# Patient Record
Sex: Female | Born: 1970 | Race: White | Hispanic: No | Marital: Married | Smoking: Never smoker
Health system: Southern US, Community
[De-identification: ages and names within clinical notes are randomized; demographics above are authoritative.]

## PROBLEM LIST (undated history)

## (undated) DIAGNOSIS — N200 Calculus of kidney: Secondary | ICD-10-CM

---

## 1999-02-27 HISTORY — PX: THYROIDECTOMY, PARTIAL: SHX18

## 2014-07-28 ENCOUNTER — Emergency Department (HOSPITAL_BASED_OUTPATIENT_CLINIC_OR_DEPARTMENT_OTHER): Payer: Managed Care, Other (non HMO)

## 2014-07-28 ENCOUNTER — Encounter (HOSPITAL_BASED_OUTPATIENT_CLINIC_OR_DEPARTMENT_OTHER): Payer: Self-pay | Admitting: Emergency Medicine

## 2014-07-28 ENCOUNTER — Emergency Department (HOSPITAL_BASED_OUTPATIENT_CLINIC_OR_DEPARTMENT_OTHER)
Admission: EM | Admit: 2014-07-28 | Discharge: 2014-07-28 | Disposition: A | Discharge status: Home / Self Care | Payer: Managed Care, Other (non HMO) | Attending: Emergency Medicine | Admitting: Emergency Medicine

## 2014-07-28 DIAGNOSIS — Z87442 Personal history of urinary calculi: Secondary | ICD-10-CM | POA: Insufficient documentation

## 2014-07-28 DIAGNOSIS — N201 Calculus of ureter: Secondary | ICD-10-CM

## 2014-07-28 DIAGNOSIS — R109 Unspecified abdominal pain: Secondary | ICD-10-CM

## 2014-07-28 DIAGNOSIS — Z3202 Encounter for pregnancy test, result negative: Secondary | ICD-10-CM | POA: Insufficient documentation

## 2014-07-28 HISTORY — DX: Calculus of kidney: N20.0

## 2014-07-28 LAB — BASIC METABOLIC PANEL
ANION GAP: 9 (ref 5–15)
BUN: 21 mg/dL — ABNORMAL HIGH (ref 6–20)
CALCIUM: 8.8 mg/dL — AB (ref 8.9–10.3)
CHLORIDE: 103 mmol/L (ref 101–111)
CO2: 25 mmol/L (ref 22–32)
Creatinine, Ser: 0.86 mg/dL (ref 0.44–1.00)
GFR calc Af Amer: >60 mL/min (ref >60)
GFR calc non Af Amer: >60 mL/min (ref >60)
Glucose, Bld: 117 mg/dL — ABNORMAL HIGH (ref 65–99)
Potassium: 3.8 mmol/L (ref 3.5–5.1)
SODIUM: 137 mmol/L (ref 135–145)

## 2014-07-28 LAB — URINE MICROSCOPIC-ADD ON

## 2014-07-28 LAB — PREGNANCY, URINE: PREG TEST UR: NEGATIVE

## 2014-07-28 LAB — URINALYSIS, ROUTINE W REFLEX MICROSCOPIC
Bilirubin Urine: NEGATIVE
Glucose, UA: NEGATIVE mg/dL
Ketones, ur: NEGATIVE mg/dL
Leukocytes, UA: NEGATIVE
NITRITE: NEGATIVE
PH: 5.5 (ref 5.0–8.0)
Protein, ur: 30 mg/dL — AB
Specific Gravity, Urine: 1.034 — ABNORMAL HIGH (ref 1.005–1.030)
UROBILINOGEN UA: 0.2 mg/dL (ref 0.0–1.0)

## 2014-07-28 MED ORDER — SODIUM CHLORIDE 0.9 % IV BOLUS (SEPSIS)
1000.0000 mL | Freq: Once | INTRAVENOUS | Status: AC
  Administered 2014-07-28: 1000 mL via INTRAVENOUS

## 2014-07-28 MED ORDER — OXYCODONE-ACETAMINOPHEN 10-325 MG PO TABS: 1.0000 | ORAL_TABLET | ORAL | Status: AC | PRN

## 2014-07-28 MED ORDER — ONDANSETRON 4 MG PO TBDP: 4.0000 mg | ORAL_TABLET | Freq: Three times a day (TID) | ORAL | Status: AC | PRN

## 2014-07-28 MED ORDER — HYDROMORPHONE HCL 1 MG/ML IJ SOLN
1.0000 mg | Freq: Once | INTRAMUSCULAR | Status: AC
  Administered 2014-07-28: 1 mg via INTRAVENOUS
  Filled 2014-07-28: qty 1

## 2014-07-28 MED ORDER — KETOROLAC TROMETHAMINE 30 MG/ML IJ SOLN
30.0000 mg | Freq: Once | INTRAMUSCULAR | Status: AC
  Administered 2014-07-28: 30 mg via INTRAVENOUS
  Filled 2014-07-28: qty 1

## 2014-07-28 MED ORDER — TAMSULOSIN HCL 0.4 MG PO CAPS: 0.4000 mg | ORAL_CAPSULE | Freq: Every day | ORAL | Status: AC

## 2014-07-28 MED ORDER — ONDANSETRON HCL 4 MG/2ML IJ SOLN
4.0000 mg | Freq: Once | INTRAMUSCULAR | Status: AC
  Administered 2014-07-28: 4 mg via INTRAVENOUS
  Filled 2014-07-28: qty 2

## 2014-07-28 NOTE — ED Provider Notes (Addendum)
CSN: [id number]     Arrival date & time 07/28/14  1203 History   First MD Initiated Contact with Patient 07/28/14 1312     Chief Complaint  Patient presents with  . Flank Pain    left side     (Consider location/radiation/quality/duration/timing/severity/associated sxs/prior Treatment) HPI Comments: Acute onset of left flank pain that started this morning. No fever. Has a history of stones. Had bladder spasms last night  Patient is a 44 y.o. female presenting with flank pain. The history is provided by the patient. No language interpreter was used.  Flank Pain This is a new problem. The current episode started today. The problem occurs constantly. The problem has been unchanged. Associated symptoms include vomiting. Pertinent negatives include no fever. Nothing aggravates the symptoms. She has tried nothing for the symptoms.    Past Medical History  Diagnosis Date  . Kidney stones    History reviewed. No pertinent past surgical history. History reviewed. No pertinent family history. History  Substance Use Topics  . Smoking status: Never Smoker   . Smokeless tobacco: Not on file  . Alcohol Use: No   OB History    No data available     Review of Systems  Constitutional: Negative for fever.  Gastrointestinal: Positive for vomiting.  Genitourinary: Positive for flank pain.      Allergies  Review of patient's allergies indicates no known allergies.  Home Medications   Prior to Admission medications   Medication Sig Start Date End Date Taking? Authorizing Provider  Norethindrone-Eth Estradiol (NORTREL 0.5/35, 28, PO) Take by mouth.   Yes Historical Provider, MD   BP 138/69 mmHg  Pulse 85  Temp(Src) 98.1 F (36.7 C) (Oral)  Resp 18  Ht  (1.676 m)  Wt 150 lb (68.04 kg)  BMI 24.22 kg/m2  SpO2 100%  LMP 07/28/2014 Physical Exam  Constitutional: She is oriented to person, place, and time. She appears well-developed and well-nourished.  Cardiovascular: Regular  rhythm.   Pulmonary/Chest: Effort normal.  Abdominal: Soft.  Musculoskeletal: Normal range of motion.  Neurological: She is alert and oriented to person, place, and time. Coordination normal.  Skin: Skin is warm and dry.    ED Course  Procedures (including critical care time) Labs Review Labs Reviewed  URINALYSIS, ROUTINE W REFLEX MICROSCOPIC (NOT AT [facility]) - Abnormal; Notable for the following:    APPearance CLOUDY (*)    Specific Gravity, Urine 1.034 (*)    Hgb urine dipstick LARGE (*)    Protein, ur 30 (*)    All other components within normal limits  URINE MICROSCOPIC-ADD ON - Abnormal; Notable for the following:    Squamous Epithelial / LPF FEW (*)    Bacteria, UA FEW (*)    All other components within normal limits  BASIC METABOLIC PANEL - Abnormal; Notable for the following:    Glucose, Bld 117 (*)    BUN 21 (*)    Calcium 8.8 (*)    All other components within normal limits  PREGNANCY, URINE    Imaging Review Ct Renal Stone Study  07/28/2014   CLINICAL DATA:  Left flank pain since this morning. Hematuria. Personal history of kidney stones.  EXAM: CT ABDOMEN AND PELVIS WITHOUT CONTRAST  TECHNIQUE: Multidetector CT imaging of the abdomen and pelvis was performed following the standard protocol without IV contrast.  COMPARISON:  None.  FINDINGS: The visualized lung bases are clear.  The liver, gallbladder, spleen, adrenal glands, and pancreas have an unremarkable unenhanced appearance. There  is a 6 mm obstructing calculus in the distal left ureter just proximal to the UVJ. There is mild left hydroureteronephrosis. No right renal calculi or right-sided hydronephrosis is seen.  The small and large bowel are nondilated. Bladder is decompressed. Uterus and ovaries are grossly unremarkable. Minimal aortic calcification is noted. No free fluid or enlarged lymph nodes are identified. No acute osseous abnormality is identified. There are 2 small lesions in the right ilium measuring  approximately 1.4 cm in size which are partially sclerotic and nonspecific, possibly chondroid lesions and without aggressive changes identified.  IMPRESSION: 6 mm distal left ureteral stone with mild hydroureteronephrosis.   Electronically Signed   By: Sebastian AcheAllen  Grady   On: 07/28/2014 14:10     EKG Interpretation None      MDM   Final diagnoses:  Ureteral stone    Pt is feeling better at this time. Pt sent home with percocet, flomax and zofran. Pt given urology follow up and return precautions   Teressa LowerVrinda Kyro Joswick, NP 07/28/14 1510  Doug SouSam Jacubowitz, MD 07/28/14 1511  Teressa LowerVrinda Belton Peplinski, NP 08/09/14 1404  Doug SouSam Jacubowitz, MD 08/11/14 0010

## 2014-07-28 NOTE — ED Notes (Signed)
Pt states she started having left side pain this morning around 830 and having bladder spasms last night

## 2014-07-28 NOTE — ED Notes (Signed)
PO fluids provided. 

## 2014-07-28 NOTE — ED Notes (Signed)
E-Signature pad is down. Pt states understanding of discharge instructions.

## 2014-07-28 NOTE — Discharge Instructions (Signed)
Urine Strainer This strainer is used to catch or filter out any stones found in your urine. Place the strainer under your urine stream. Save any stones or objects that you find in your urine. Place them in a plastic or glass container to show your caregiver. The stones vary in size - some can be very small, so make sure you check the strainer carefully. Your caregiver may send the stone to the lab. When the results are back, your caregiver may recommend medicines or diet changes.  Document Released: 11/18/2003 Document Revised: 05/07/2011 Document Reviewed: 12/26/2007 [facility] Patient Information 2015 [place], [place]. This information is not intended to replace advice given to you by your health care provider. Make sure you discuss any questions you have with your health care provider.  Kidney Stones Kidney stones (urolithiasis) are solid masses that form inside your kidneys. The intense pain is caused by the stone moving through the kidney, ureter, bladder, and urethra (urinary tract). When the stone moves, the ureter starts to spasm around the stone. The stone is usually passed in your pee (urine).  HOME CARE  Drink enough fluids to keep your pee clear or pale yellow. This helps to get the stone out.  Strain all pee through the provided strainer. Do not pee without peeing through the strainer, not even once. If you pee the stone out, catch it in the strainer. The stone may be as small as a grain of salt. Take this to your doctor. This will help your doctor figure out what you can do to try to prevent more kidney stones.  Only take medicine as told by your doctor.  Follow up with your doctor as told.  Get follow-up X-rays as told by your doctor. GET HELP IF: You have pain that gets worse even if you have been taking pain medicine. GET HELP RIGHT AWAY IF:   Your pain does not get better with medicine.  You have a fever or shaking chills.  Your pain increases and gets worse over 18  hours.  You have new belly (abdominal) pain.  You feel faint or pass out.  You are unable to pee. MAKE SURE YOU:   Understand these instructions.  Will watch your condition.  Will get help right away if you are not doing well or get worse. Document Released: 08/01/2007 Document Revised: 10/15/2012 Document Reviewed: 07/16/2012 [facility] Patient Information 2015 [place], [place]. This information is not intended to replace advice given to you by your health care provider. Make sure you discuss any questions you have with your health care provider.

## 2014-08-02 ENCOUNTER — Other Ambulatory Visit: Payer: Self-pay | Admitting: Urology

## 2014-08-04 ENCOUNTER — Encounter (HOSPITAL_COMMUNITY): Payer: Self-pay

## 2014-08-04 NOTE — H&P (Signed)
Active Problems Problems  1. Calculus of left ureter (N20.1)  History of Present Illness Mary Baker is a 44 yo WF who is sent for a LUVJ stone.  She had the onset of urgency and frequency about a month ago and had blood in the urine on a routine exam. Weds she began to have severe left flank pain with nausea and vomiting. She was seen in the ER and had a 80mm stone at the [place]. She may have had a stone when she was pregnant 12 years ago. She has had no other GU history. She has bladder pain today but the flank pain is not so bad.   Past Medical History Problems  1. History of kidney stones (Q22.297)  Surgical History Problems  1. History of Thyroid Surgery  Current Meds 1. Flomax 0.4 MG CPCR;  Therapy: (Recorded:06Jun2016) to Recorded 2. Nortrel 0.5/35 (28) TABS;  Therapy: (Recorded:06Jun2016) to Recorded 3. Oxycodone-Acetaminophen 5-325 MG Oral Tablet;  Therapy: [id number] to Recorded  Allergies Medication  1. No Known Drug Allergies  Family History Problems  1. Family history of Death of parent : Father   Father died age 62 from lung cancer 2. No significant family history : Mother, Father  Social History Problems    Denied: History of Alcohol use   Married   Never smoker   Occupation   Water engineer   Two children   1 daughter and 1 son  Review of Systems Genitourinary, constitutional, skin, eye, otolaryngeal, hematologic/lymphatic, cardiovascular, pulmonary, endocrine, musculoskeletal, gastrointestinal, neurological and psychiatric system(s) were reviewed and pertinent findings if present are noted and are otherwise negative.  Genitourinary: urinary urgency and incontinence (mild SUI prior to the stone but now she has some UUI).    Vitals Vital Signs [Data Includes: Last 1 Day]  Recorded: 06Jun2016 09:39AM  Height: 5 ft 6 in Weight: 150 lb  BMI Calculated: 24.21 BSA Calculated: 1.77 Blood Pressure: 104 / 71 Temperature: 98.6 F Heart Rate: 94  Physical  Exam Constitutional: Well nourished and well developed . No acute distress.  ENT:. The ears and nose are normal in appearance.  Neck: The appearance of the neck is normal and no neck mass is present.  Pulmonary: No respiratory distress and normal respiratory rhythm and effort.  Cardiovascular: Heart rate and rhythm are normal . No peripheral edema.  Abdomen: The abdomen is soft and nontender (except for mild LLQ tenderness). No masses are palpated. mild left CVA tenderness. No hernias are palpable. No hepatosplenomegaly noted.  Lymphatics: The posterior cervical and supraclavicular nodes are not enlarged or tender.  Skin: Normal skin turgor, no visible rash and no visible skin lesions.  Neuro/Psych:. Mood and affect are appropriate.    Results/Data Urine [Data Includes: Last 1 Day]   [id number]  COLOR YELLOW   APPEARANCE CLEAR   SPECIFIC GRAVITY 1.020   pH 6.5   GLUCOSE NEG mg/dL  BILIRUBIN NEG   KETONE NEG mg/dL  BLOOD MOD   PROTEIN NEG mg/dL  UROBILINOGEN 0.2 mg/dL  NITRITE NEG   LEUKOCYTE ESTERASE NEG   SQUAMOUS EPITHELIAL/HPF FEW   WBC 0-2 WBC/hpf  RBC 3-6 RBC/hpf  BACTERIA RARE   CRYSTALS NONE SEEN   CASTS NONE SEEN   Other MUCUS NOTED    Old records or history reviewed: I have reviewed her ER records and CT films and report.  The following images/tracing/specimen were independently visualized:  KUB today shows no change in the location of the LUVJ stone that is 3x30mm. There are no other  bone, gas or soft tissue abnormalities.  The following clinical lab reports were reviewed:  UA reviewed.    Assessment Assessed  1. Calculus of left ureter (N20.1)  She has a symptomatic LUVJ stone that has probably been present for the last month, but just caused pain last week.   Plan Calculus of left ureter  1. Follow-up Schedule Surgery Office  Follow-up  Status: Complete  Done: [id number] 2. KUB; Status:Resulted - Requires Verification;   Done: [id number] 10:01AM Health  Maintenance  3. UA With REFLEX; [Do Not Release]; Status:Resulted - Requires Verification;   Done:  [id number] 09:21AM  I discussed the options including continued MET, Ureteroscopy and ESWL and reviewed the side effects and potential success rates.  She has elected to have ESWL and I will try to get her on the schedule for Thursday. I reviewed the risks of bleeding, infection, failure of fragmentation or obstructing fragments requiring secondary procedures, injury to adjacent structures, thrombotic events and sedation complications.

## 2014-08-05 ENCOUNTER — Encounter (HOSPITAL_COMMUNITY)
Admission: RE | Disposition: A | Discharge status: Home / Self Care | Payer: Self-pay | Source: Ambulatory Visit | Attending: Urology

## 2014-08-05 ENCOUNTER — Ambulatory Visit (HOSPITAL_COMMUNITY): Payer: Managed Care, Other (non HMO)

## 2014-08-05 ENCOUNTER — Ambulatory Visit (HOSPITAL_COMMUNITY)
Admission: RE | Admit: 2014-08-05 | Discharge: 2014-08-05 | Disposition: A | Discharge status: Home / Self Care | Payer: Managed Care, Other (non HMO) | Source: Ambulatory Visit | Attending: Urology | Admitting: Urology

## 2014-08-05 ENCOUNTER — Encounter (HOSPITAL_COMMUNITY): Payer: Self-pay | Admitting: *Deleted

## 2014-08-05 DIAGNOSIS — Z538 Procedure and treatment not carried out for other reasons: Secondary | ICD-10-CM | POA: Insufficient documentation

## 2014-08-05 DIAGNOSIS — R109 Unspecified abdominal pain: Secondary | ICD-10-CM | POA: Diagnosis present

## 2014-08-05 DIAGNOSIS — N201 Calculus of ureter: Secondary | ICD-10-CM

## 2014-08-05 DIAGNOSIS — Z79891 Long term (current) use of opiate analgesic: Secondary | ICD-10-CM | POA: Insufficient documentation

## 2014-08-05 DIAGNOSIS — Z87442 Personal history of urinary calculi: Secondary | ICD-10-CM | POA: Diagnosis not present

## 2014-08-05 DIAGNOSIS — N210 Calculus in bladder: Secondary | ICD-10-CM | POA: Diagnosis not present

## 2014-08-05 DIAGNOSIS — Z79899 Other long term (current) drug therapy: Secondary | ICD-10-CM | POA: Insufficient documentation

## 2014-08-05 LAB — PREGNANCY, URINE: Preg Test, Ur: NEGATIVE

## 2014-08-05 SURGERY — LITHOTRIPSY, ESWL
Anesthesia: LOCAL | Laterality: Left

## 2014-08-05 MED ORDER — CIPROFLOXACIN HCL 500 MG PO TABS
500.0000 mg | ORAL_TABLET | ORAL | Status: AC
  Administered 2014-08-05: 500 mg via ORAL
  Filled 2014-08-05: qty 1

## 2014-08-05 MED ORDER — DIAZEPAM 5 MG PO TABS
10.0000 mg | ORAL_TABLET | ORAL | Status: AC
  Administered 2014-08-05: 10 mg via ORAL
  Filled 2014-08-05: qty 2

## 2014-08-05 MED ORDER — DIPHENHYDRAMINE HCL 25 MG PO CAPS
25.0000 mg | ORAL_CAPSULE | ORAL | Status: AC
  Administered 2014-08-05: 25 mg via ORAL
  Filled 2014-08-05: qty 1

## 2014-08-05 MED ORDER — SODIUM CHLORIDE 0.9 % IV SOLN
INTRAVENOUS | Status: DC
  Administered 2014-08-05: 07:00:00 via INTRAVENOUS

## 2014-08-05 NOTE — Interval H&P Note (Signed)
History and Physical Interval Note:  She has continued to have pain and bladder spasms until yesterday.  KUB shows stone still at the LUVJ  08/05/2014 8:05 AM  Mary Baker  has presented today for surgery, with the diagnosis of left distal stone  The various methods of treatment have been discussed with the patient and family. After consideration of risks, benefits and other options for treatment, the patient has consented to  Procedure(s): LEFT EXTRACORPOREAL SHOCK WAVE LITHOTRIPSY (ESWL) (Left) as a surgical intervention .  The patient's history has been reviewed, patient examined, no change in status, stable for surgery.  I have reviewed the patient's chart and labs.  Questions were answered to the patient's satisfaction.     Mary Baker J

## 2014-08-05 NOTE — Op Note (Signed)
Fluoro on litho unit showed the stone was on the right suggesting that it had moved into the bladder.    Case cancelled.   Will give IV fluids and strain urine.   Repeat KUB if stone not passed prior to departure.

## 2014-08-05 NOTE — Interval H&P Note (Signed)
History and Physical Interval Note:  Mary Baker still present on KUB   08/05/2014 8:06 AM  Mary Baker  has presented today for surgery, with the diagnosis of left distal stone  The various methods of treatment have been discussed with the patient and family. After consideration of risks, benefits and other options for treatment, the patient has consented to  Procedure(s): LEFT EXTRACORPOREAL SHOCK WAVE LITHOTRIPSY (ESWL) (Left) as a surgical intervention .  The patient's history has been reviewed, patient examined, no change in status, stable for surgery.  I have reviewed the patient's chart and labs.  Questions were answered to the patient's satisfaction.     Amanada Philbrick J

## 2014-08-05 NOTE — Progress Notes (Signed)
Patient returned from Lithotripsy at 0805. Case was cancelled. No sedation was given. Received orders for patient to receive IV fluids.

## 2014-08-05 NOTE — Progress Notes (Signed)
Discontinue IV fluids per Dr. Annabell Howells. Repeat KUB.

## 2015-08-31 IMAGING — CT CT RENAL STONE PROTOCOL
2 of 4 series · 16 of 46 positions shown, 18 images · non-contrast
Comparison: None.

CLINICAL DATA: Left flank pain since this morning. Hematuria.
Personal history of kidney stones.

EXAM:
CT ABDOMEN AND PELVIS WITHOUT CONTRAST
TECHNIQUE: Multidetector CT imaging of the abdomen and pelvis was performed
following the standard protocol without IV contrast.

[Series 2: renal stone < 200 lbs 5.0 b31f · axial · 0.82mm/px · z∈[+809,+1269]mm · 13 of 100 slices shown, 15 images]
[im 4/100  soft-tissue]
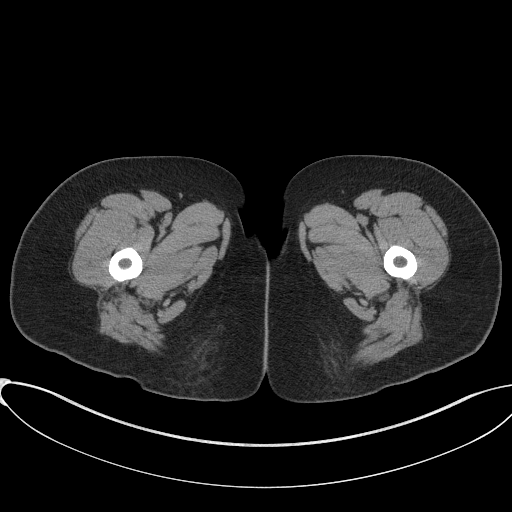
[im 4/100  bone]
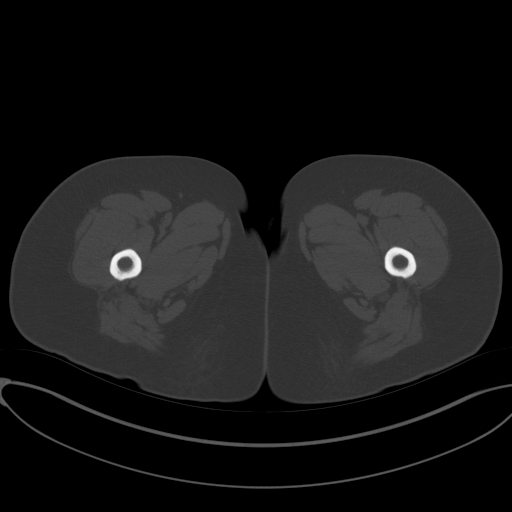
[im 12/100  soft-tissue]
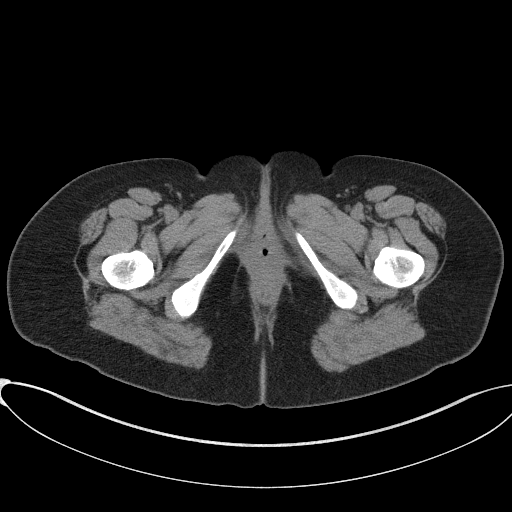
[im 20/100  soft-tissue]
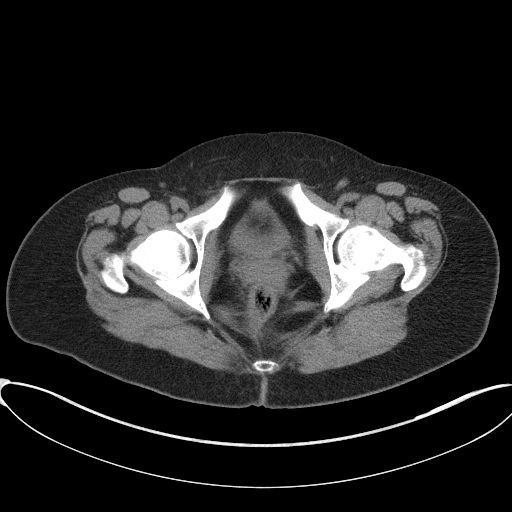
[im 28/100  soft-tissue]
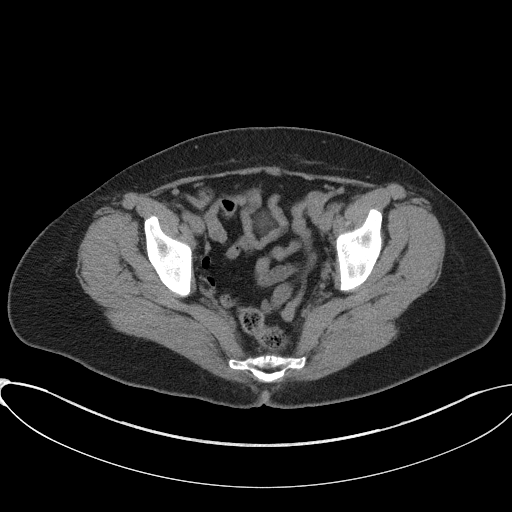
[im 36/100  soft-tissue]
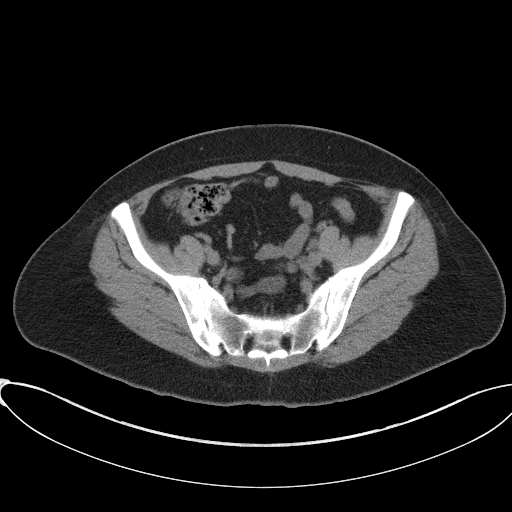
[im 44/100  soft-tissue]
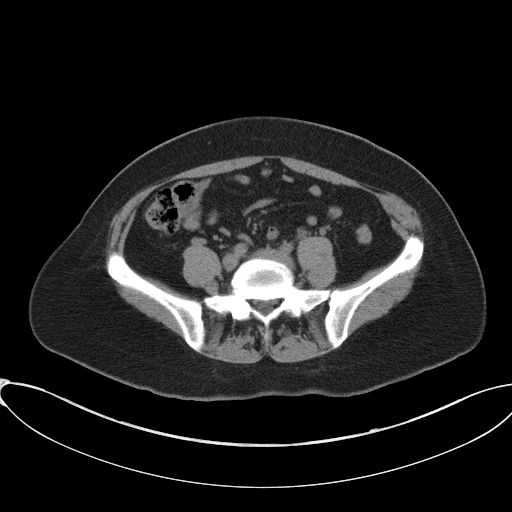
[im 52/100  soft-tissue]
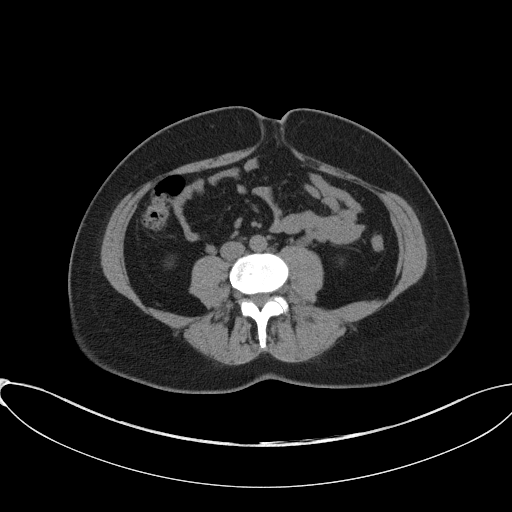
[im 56/100  soft-tissue]
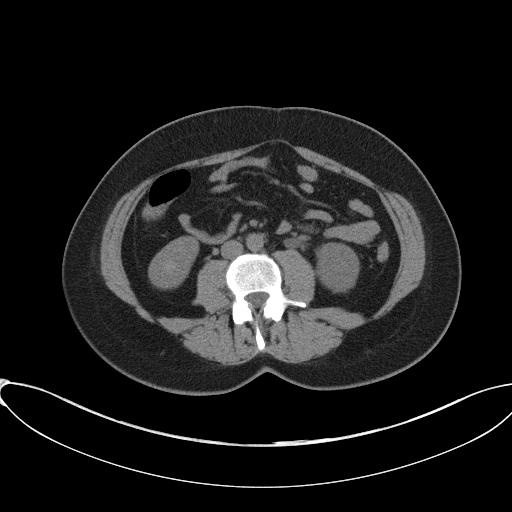
[im 64/100  soft-tissue]
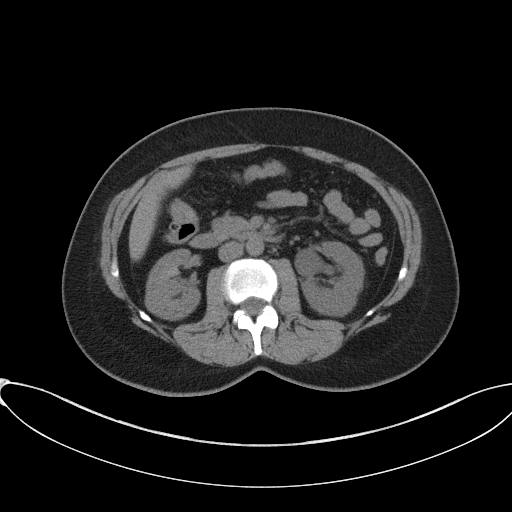
[im 64/100  bone]
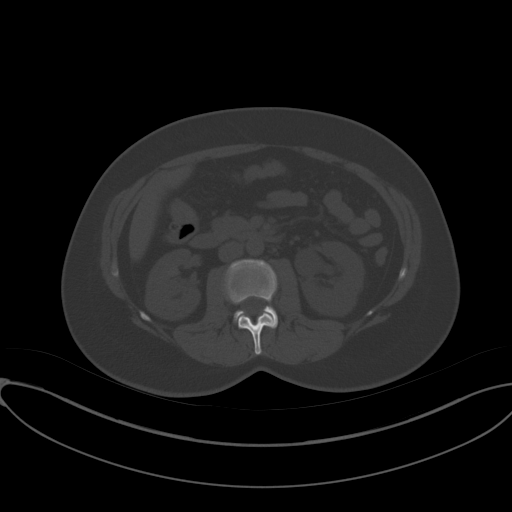
[im 72/100  soft-tissue]
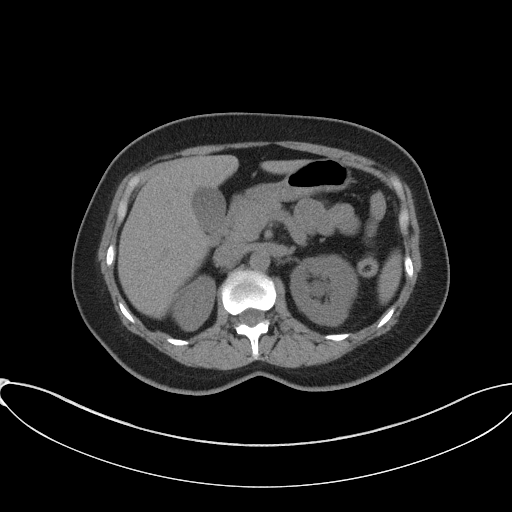
[im 80/100  soft-tissue]
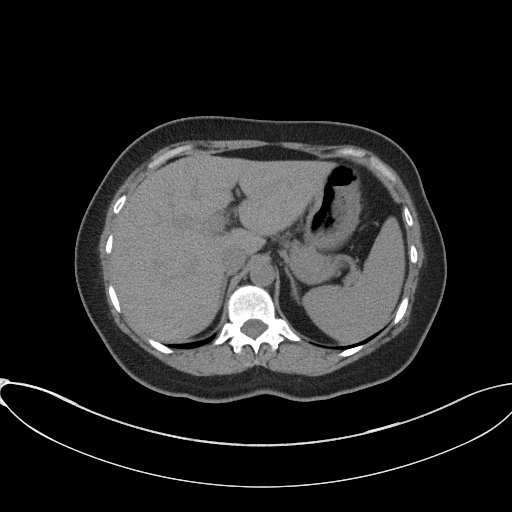
[im 88/100  soft-tissue]
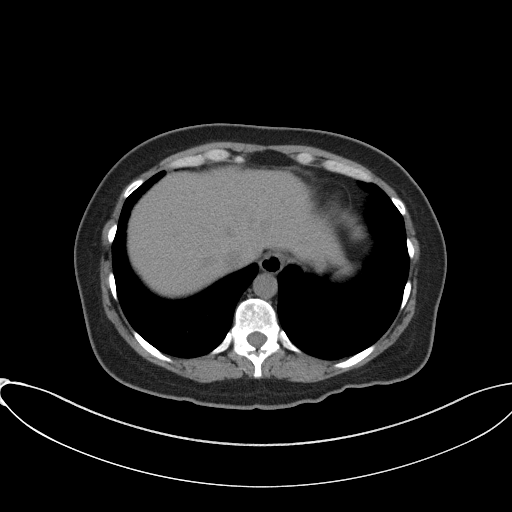
[im 96/100  soft-tissue]
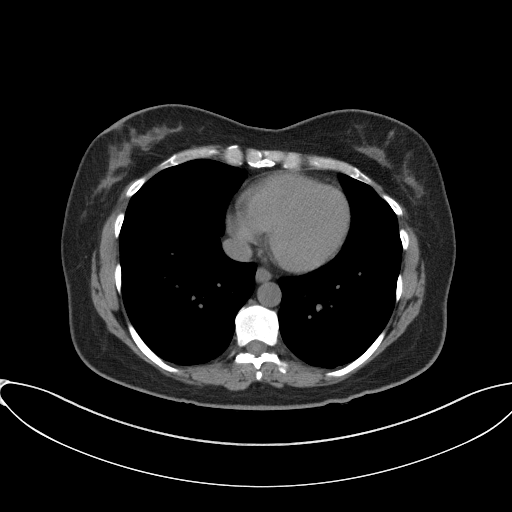

[Series 5: renal stone 3.0 coronal · coronal · 0.85mm/px · 3 of 86 slices shown]
[im 29/86  soft-tissue]
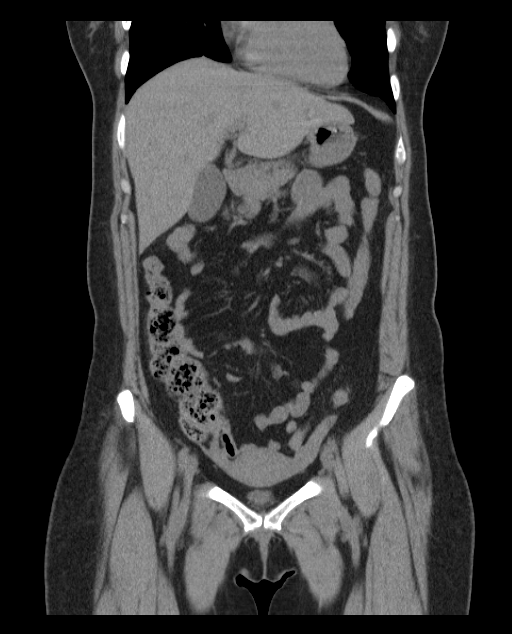
[im 38/86  soft-tissue]
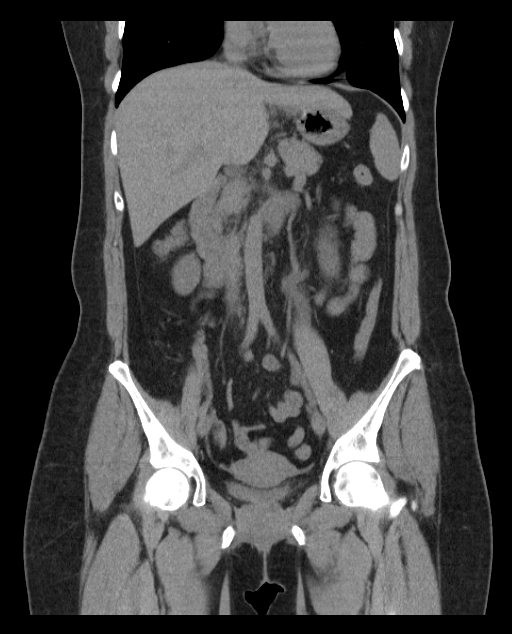
[im 48/86  soft-tissue]
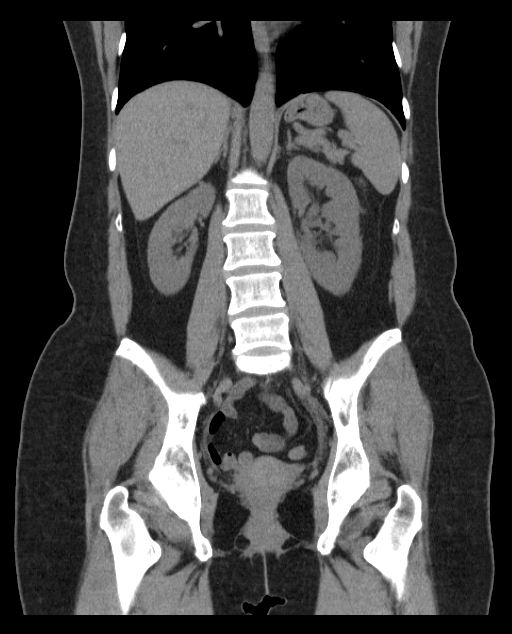

[16 of 46 positions shown; findings below may reference images not displayed]

FINDINGS: The visualized lung bases are clear.

The liver, gallbladder, spleen, adrenal glands, and pancreas have an
unremarkable unenhanced appearance. There is a 6 mm obstructing
calculus in the distal left ureter just proximal to the UVJ. There
is mild left hydroureteronephrosis. No right renal calculi or
right-sided hydronephrosis is seen.

The small and large bowel are nondilated. Bladder is decompressed.
Uterus and ovaries are grossly unremarkable. Minimal aortic
calcification is noted. No free fluid or enlarged lymph nodes are
identified. No acute osseous abnormality is identified. There are 2
small lesions in the right ilium measuring approximately 1.4 cm in
size which are partially sclerotic and nonspecific, possibly
chondroid lesions and without aggressive changes identified.
IMPRESSION: 6 mm distal left ureteral stone with mild hydroureteronephrosis.

## 2015-09-08 IMAGING — CR DG ABDOMEN 1V
1 series · 1 of 1 positions shown · non-contrast
Comparison: 08/05/2014

CLINICAL DATA: Left ureteral stone

EXAM:
ABDOMEN - 1 VIEW

[t abdomen supine]
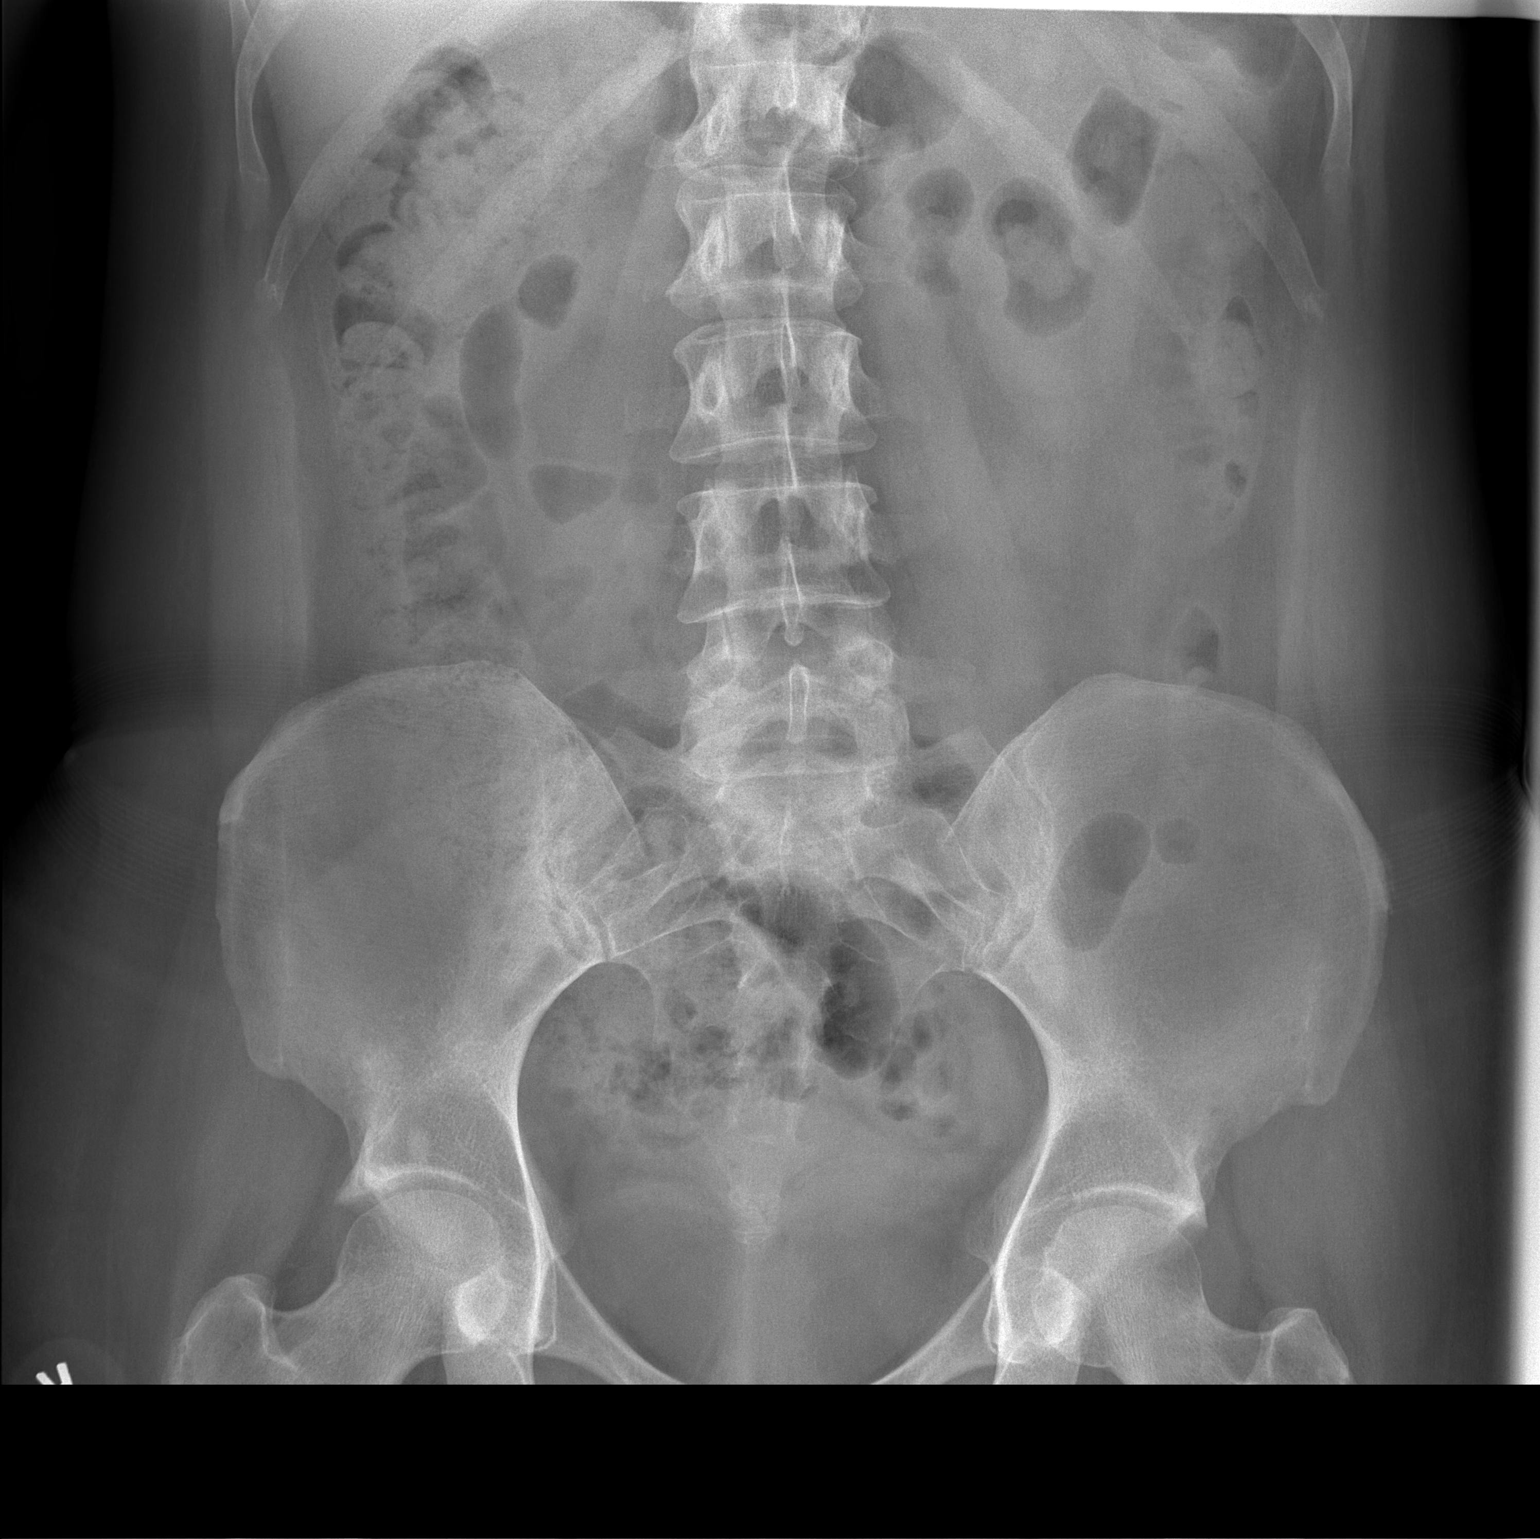

[1 of 1 positions shown; findings below may reference images not displayed]

FINDINGS: Scattered large and small bowel gas is noted. The previously seen
calcification in the left hemipelvis is no longer identified. No
other abnormal calcifications are seen. No acute bony abnormality is
noted.
IMPRESSION: Previously seen distal left ureteral stone is no longer identified.
